# Patient Record
Sex: Female | Born: 1985 | Hispanic: Yes | Marital: Married | Smoking: Never smoker
Health system: Southern US, Community
[De-identification: ages and names within clinical notes are randomized; demographics above are authoritative.]

## PROBLEM LIST (undated history)

## (undated) DIAGNOSIS — E079 Disorder of thyroid, unspecified: Secondary | ICD-10-CM

## (undated) HISTORY — DX: Disorder of thyroid, unspecified: E07.9

---

## 2020-11-04 ENCOUNTER — Emergency Department (HOSPITAL_COMMUNITY): Payer: No Typology Code available for payment source

## 2020-11-04 ENCOUNTER — Emergency Department (HOSPITAL_COMMUNITY)
Admission: EM | Admit: 2020-11-04 | Discharge: 2020-11-04 | Disposition: A | Discharge status: Home / Self Care | Payer: No Typology Code available for payment source | Attending: Emergency Medicine | Admitting: Emergency Medicine

## 2020-11-04 DIAGNOSIS — R109 Unspecified abdominal pain: Secondary | ICD-10-CM | POA: Diagnosis not present

## 2020-11-04 DIAGNOSIS — R079 Chest pain, unspecified: Secondary | ICD-10-CM | POA: Diagnosis not present

## 2020-11-04 DIAGNOSIS — M542 Cervicalgia: Secondary | ICD-10-CM

## 2020-11-04 DIAGNOSIS — Y92410 Unspecified street and highway as the place of occurrence of the external cause: Secondary | ICD-10-CM | POA: Diagnosis not present

## 2020-11-04 DIAGNOSIS — S199XXA Unspecified injury of neck, initial encounter: Secondary | ICD-10-CM | POA: Diagnosis present

## 2020-11-04 DIAGNOSIS — R519 Headache, unspecified: Secondary | ICD-10-CM | POA: Diagnosis not present

## 2020-11-04 DIAGNOSIS — S161XXA Strain of muscle, fascia and tendon at neck level, initial encounter: Secondary | ICD-10-CM | POA: Diagnosis not present

## 2020-11-04 DIAGNOSIS — T148XXA Other injury of unspecified body region, initial encounter: Secondary | ICD-10-CM

## 2020-11-04 LAB — POC URINE PREG, ED: Preg Test, Ur: NEGATIVE

## 2020-11-04 MED ORDER — OXYCODONE-ACETAMINOPHEN 5-325 MG PO TABS
1.0000 | ORAL_TABLET | Freq: Once | ORAL | Status: AC
  Administered 2020-11-04: 1 via ORAL
  Filled 2020-11-04: qty 1

## 2020-11-04 MED ORDER — METHOCARBAMOL 500 MG PO TABS: 500.0000 mg | ORAL_TABLET | Freq: Two times a day (BID) | ORAL | 0 refills | Status: AC

## 2020-11-04 MED ORDER — ONDANSETRON 4 MG PO TBDP
4.0000 mg | ORAL_TABLET | Freq: Once | ORAL | Status: AC
  Administered 2020-11-04: 4 mg via ORAL
  Filled 2020-11-04: qty 1

## 2020-11-04 NOTE — ED Notes (Signed)
All appropriate discharge materials reviewed at length with patient. Time for questions provided. Pt has no other questions at this time and verbalizes understanding of all provided materials.  
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
## 2020-11-04 NOTE — Discharge Instructions (Signed)
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
## 2022-02-25 IMAGING — CT CT CERVICAL SPINE W/O CM
3 of 4 series · 13 of 33 positions shown, 16 images · non-contrast
Comparison: None.

CLINICAL DATA: Neck pain and stiffness.

EXAM:
CT CERVICAL SPINE WITHOUT CONTRAST
TECHNIQUE: Multidetector CT imaging of the cervical spine was performed without
intravenous contrast. Multiplanar CT image reconstructions were also
generated.

[Series 4: c_spine 2.0 st · axial · 0.37mm/px · z∈[-244,-94]mm · 5 of 113 slices shown, 7 images]
[im 19/113  soft-tissue]
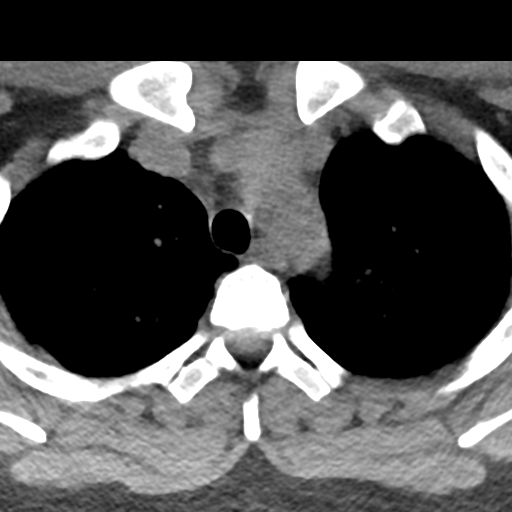
[im 19/113  bone]
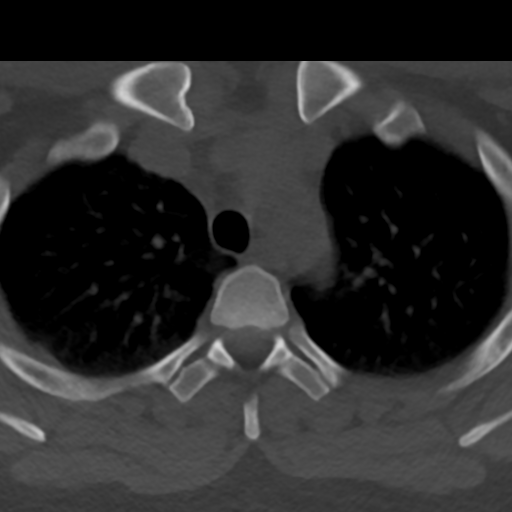
[im 38/113  bone]
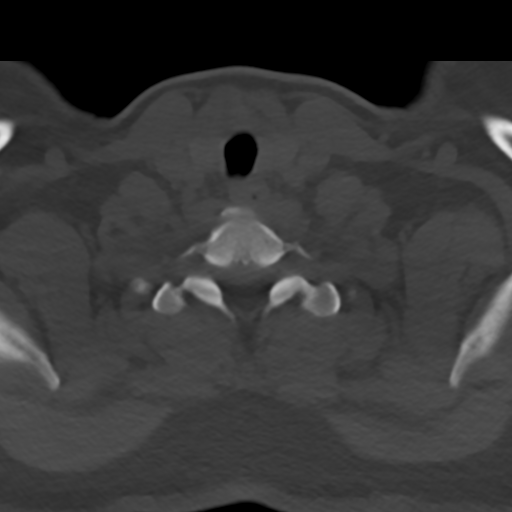
[im 57/113  bone]
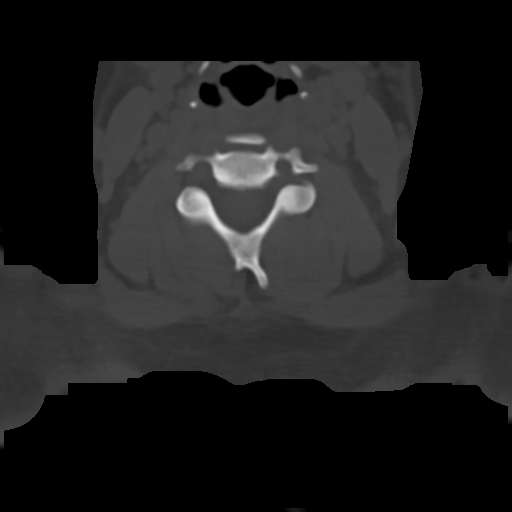
[im 75/113  bone]
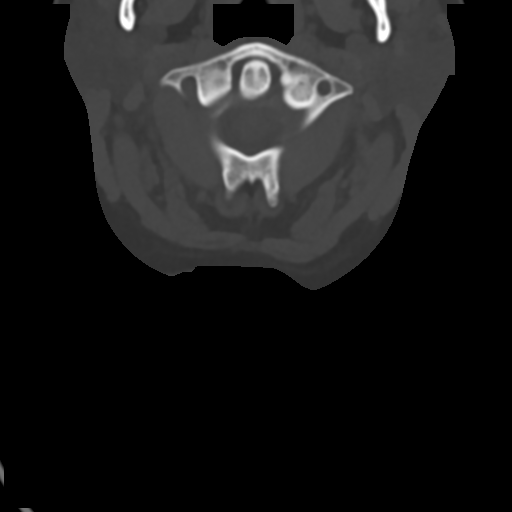
[im 94/113  soft-tissue]
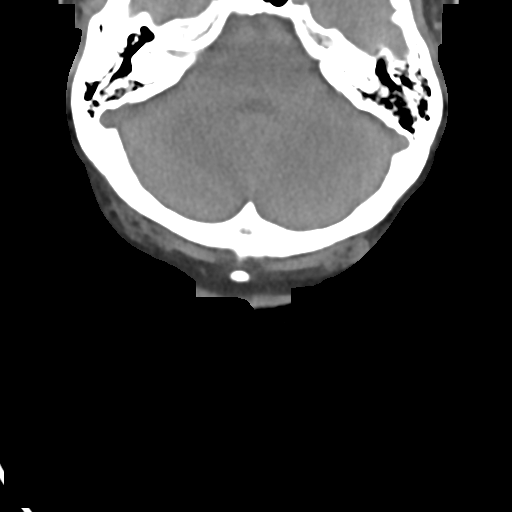
[im 94/113  bone]
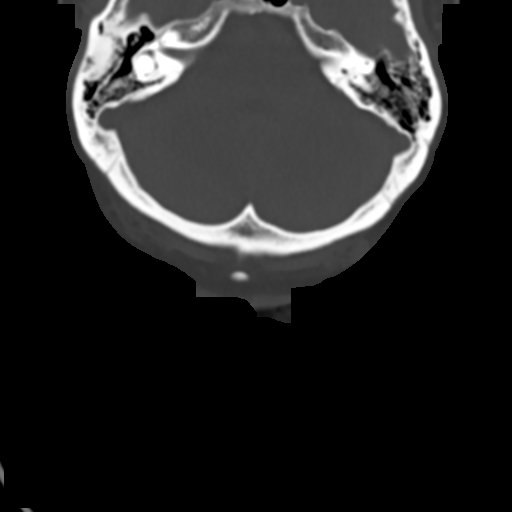

[Series 6: c_spine 2.0 sag bone · sagittal · 0.44mm/px · 5 of 61 slices shown, 6 images]
[im 21/61  bone]
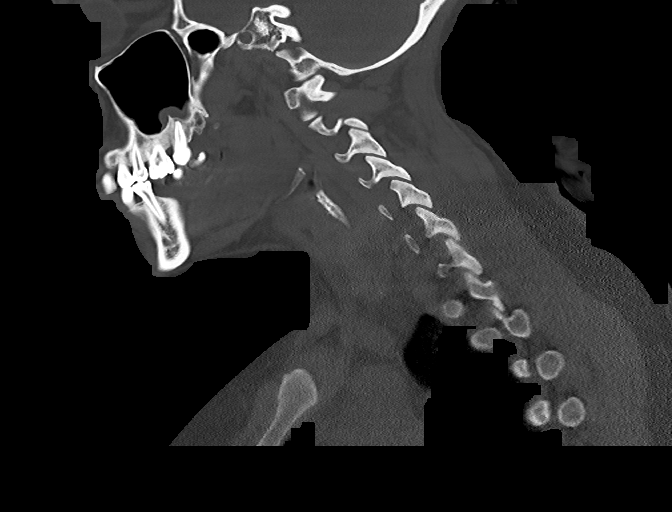
[im 26/61  bone]
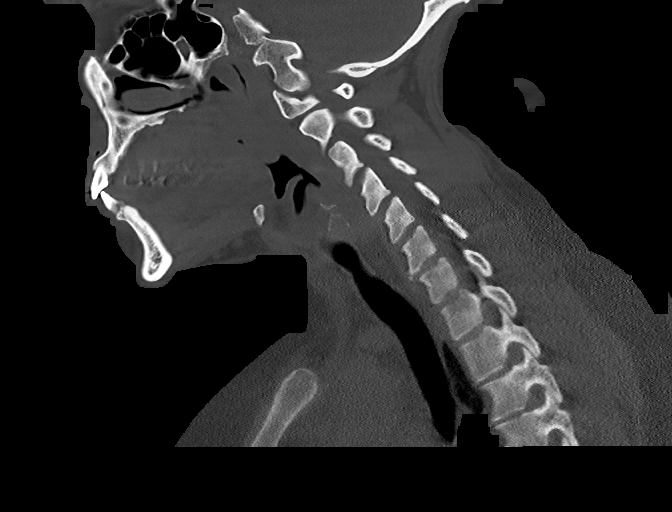
[im 31/61  soft-tissue]
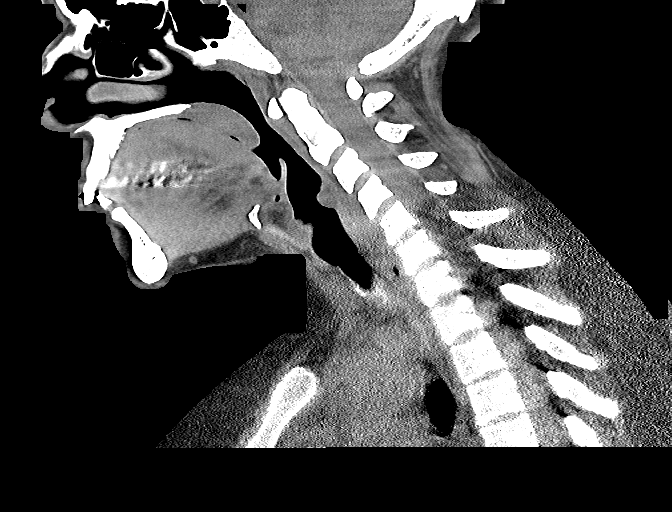
[im 31/61  bone]
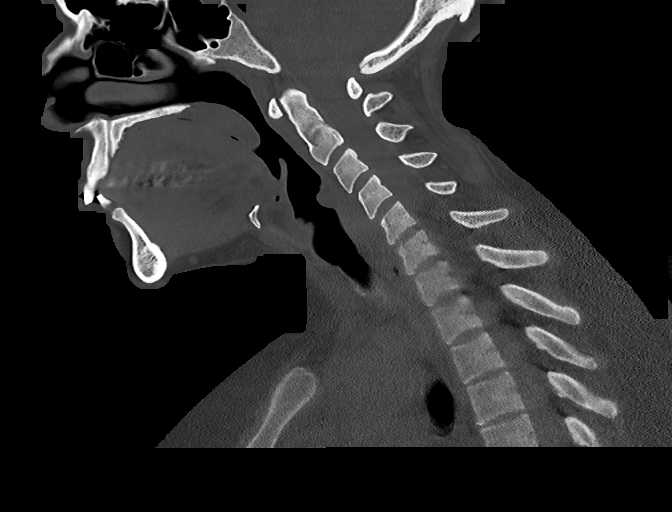
[im 36/61  bone]
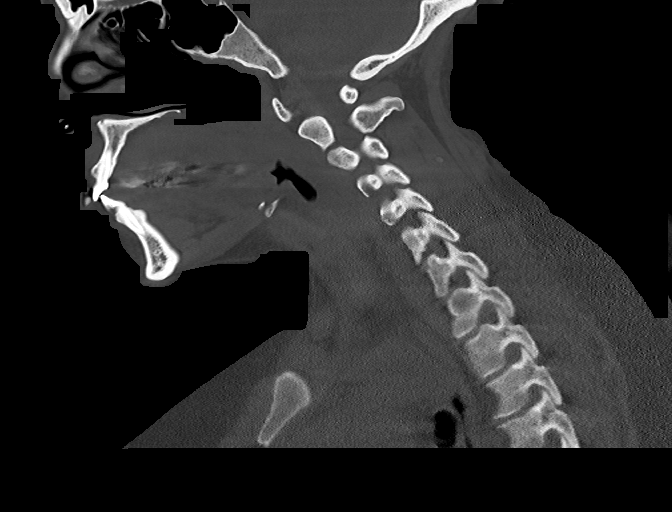
[im 41/61  bone]
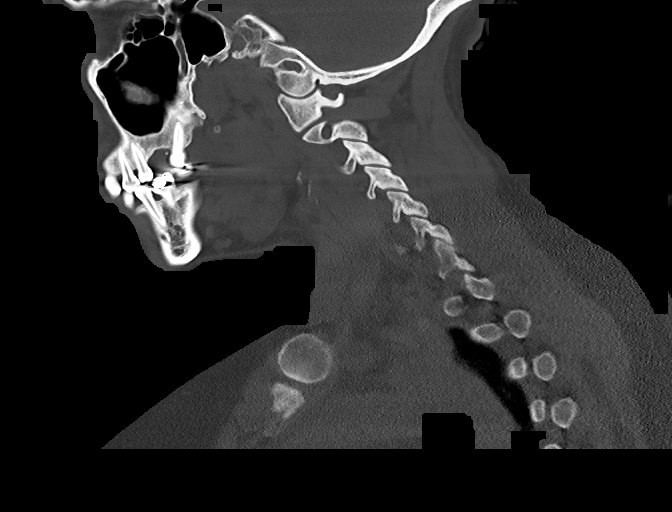

[Series 7: c_spine 2.0 cor bone · coronal · 0.33mm/px · 3 of 135 slices shown]
[im 27/135  bone]
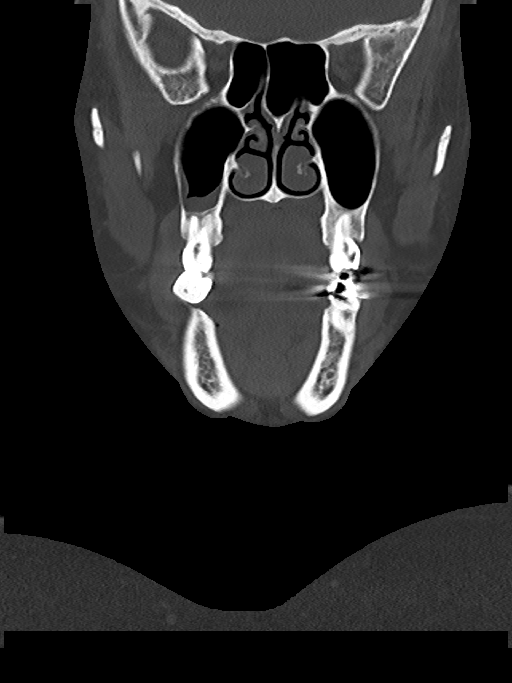
[im 54/135  bone]
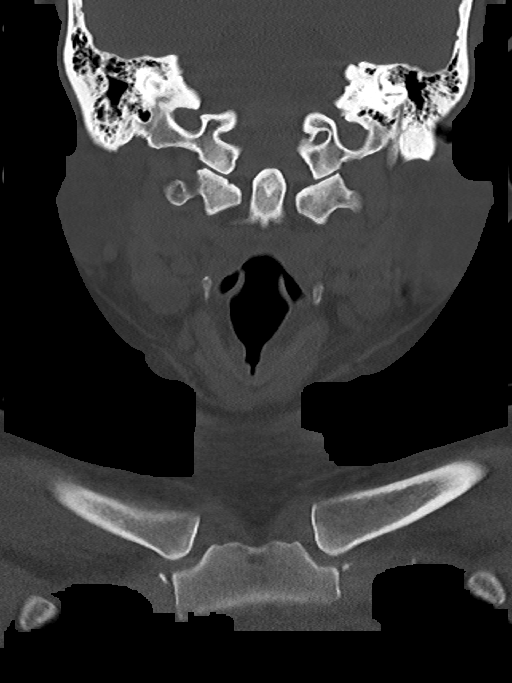
[im 81/135  bone]
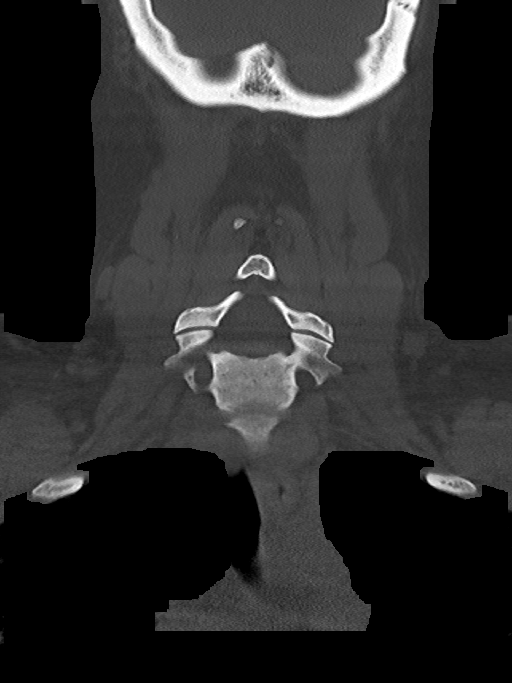

[13 of 33 positions shown; findings below may reference images not displayed]

FINDINGS: Alignment: Reversal of normal cervical lordosis is noted which may
be due to muscle spasm or patient positioning.

Skull base and vertebrae: No acute fracture or dislocation.

Soft tissues and spinal canal: No prevertebral fluid or swelling. No
visible canal hematoma.

Disc levels: Mild disc space narrowing and endplate spurring is
noted at C6-7.

Upper chest: Negative.

Other: None
IMPRESSION: 1. No acute abnormality.
2. Mild degenerative disc disease at C6-7

## 2024-08-04 ENCOUNTER — Other Ambulatory Visit: Payer: Self-pay

## 2024-08-04 ENCOUNTER — Encounter (HOSPITAL_COMMUNITY): Payer: Self-pay | Admitting: *Deleted

## 2024-08-04 ENCOUNTER — Emergency Department (HOSPITAL_COMMUNITY): Admission: EM | Admit: 2024-08-04 | Discharge: 2024-08-04 | Disposition: A | Discharge status: Home / Self Care

## 2024-08-04 DIAGNOSIS — J029 Acute pharyngitis, unspecified: Secondary | ICD-10-CM | POA: Diagnosis present

## 2024-08-04 DIAGNOSIS — H9209 Otalgia, unspecified ear: Secondary | ICD-10-CM | POA: Diagnosis not present

## 2024-08-04 MED ORDER — IBUPROFEN 400 MG PO TABS
600.0000 mg | ORAL_TABLET | Freq: Once | ORAL | Status: AC
  Administered 2024-08-04: 600 mg via ORAL
  Filled 2024-08-04: qty 1

## 2024-08-04 MED ORDER — AMOXICILLIN 500 MG PO CAPS
500.0000 mg | ORAL_CAPSULE | Freq: Once | ORAL | Status: AC
  Administered 2024-08-04: 500 mg via ORAL
  Filled 2024-08-04: qty 1

## 2024-08-04 MED ORDER — AMOXICILLIN 500 MG PO CAPS: 500.0000 mg | ORAL_CAPSULE | Freq: Two times a day (BID) | ORAL | 0 refills | Status: AC

## 2024-08-04 NOTE — ED Triage Notes (Signed)
 Bilateral ear pain for 2 days, denies any fever or chills.

## 2024-08-04 NOTE — Discharge Instructions (Signed)
 Evaluation was overall reassuring but inspection of the throat was concerning for strep pharyngitis.  Will go ahead and treat you with amoxicillin.  Please take it for the next 10 days and follow-up with your primary care doctor.  You can take Tylenol  and ibuprofen as needed for pain and fever control.  If you have any trouble breathing or trouble swallowing start to drool or persistently high fever please return to the ED for further evaluation.

## 2024-08-04 NOTE — ED Provider Notes (Signed)
 " [place] EMERGENCY DEPARTMENT AT [facility] Provider Note   CSN: [id number] Arrival date & time: 08/04/24  2204     Patient presents with: Otalgia  HPI Tracy Kaufman is a 39 y.o. female presenting for bilateral ear pain and sore throat.  Has been going on for about a week.  Reports that she had the flu over Christmas with her son.  She states she is mostly feeling better but has had a persistently sore throat her son's had the same symptom.  Had a fever last week but no longer.  Still able to eat and drink but is painful at times.  Denies trouble breathing or chest pain.  Denies tinnitus or hearing loss.  Denies any pain behind the ears.    Otalgia      Prior to Admission medications  Medication Sig Start Date End Date Taking? Authorizing Provider  amoxicillin (AMOXIL) 500 MG capsule Take 1 capsule (500 mg total) by mouth 2 (two) times daily for 10 days. 08/04/24 08/14/24 Yes Lang Norleen POUR, PA-C  methocarbamol  (ROBAXIN ) 500 MG tablet Take 1 tablet (500 mg total) by mouth 2 (two) times daily. 11/04/20   Layden, Lindsey A, PA-C    Allergies: Patient has no known allergies.    Review of Systems  HENT:  Positive for ear pain.     Updated Vital Signs BP (!) 143/78 (BP Location: Right Arm)   Pulse (!) 106   Temp 98.1 F (36.7 C)   Resp 18   Ht 5' 4 (1.626 m)   Wt 90.7 kg   LMP 07/06/2024   SpO2 94%   BMI 34.33 kg/m   Physical Exam Vitals and nursing note reviewed.  HENT:     Head: Normocephalic and atraumatic.     Right Ear: Hearing, tympanic membrane, ear canal and external ear normal.     Left Ear: Hearing, tympanic membrane, ear canal and external ear normal.     Mouth/Throat:     Mouth: Mucous membranes are moist.     Comments: Erythema and edema noted in the posterior oropharynx.  Uvula is also swollen but midline.  No abscess noted.  Soft palate is nonedematous.  Tonsillar exudate noted on the left side.  Eyes:     General:        Right eye: No  discharge.        Left eye: No discharge.     Conjunctiva/sclera: Conjunctivae normal.  Cardiovascular:     Rate and Rhythm: Normal rate and regular rhythm.     Pulses: Normal pulses.     Heart sounds: Normal heart sounds.  Pulmonary:     Effort: Pulmonary effort is normal.     Breath sounds: Normal breath sounds.  Abdominal:     General: Abdomen is flat.     Palpations: Abdomen is soft.  Skin:    General: Skin is warm and dry.  Neurological:     General: No focal deficit present.  Psychiatric:        Mood and Affect: Mood normal.     (all labs ordered are listed, but only abnormal results are displayed) Labs Reviewed - No data to display  EKG: None  Radiology: No results found.   Procedures   Medications Ordered in the ED  amoxicillin (AMOXIL) capsule 500 mg (has no administration in time range)  ibuprofen (ADVIL) tablet 600 mg (has no administration in time range)  Medical Decision Making  39 year old well-appearing female presenting for persistent sore throat and otalgia.  Exam of her ears relatively normal.  No pain tenderness or swelling behind the ear.  Posterior throat findings are concerning for strep throat could still be residual symptoms from flu but will go ahead and proceed with treatment of strep with amoxicillin.  Advised her to follow with her PCP.  No suggestions of peritonsillar abscess.  She looks well and nonseptic.  Feel she safe for discharge. Discussed return precautions. Discharged good condition.     Final diagnoses:  Otalgia, unspecified laterality  Sore throat    ED Discharge Orders          Ordered    amoxicillin (AMOXIL) 500 MG capsule  2 times daily        08/04/24 2239               Michala Deblanc K, PA-C 08/04/24 2240    Ula Prentice SAUNDERS, MD 08/04/24 2356  "

## 2024-08-16 ENCOUNTER — Ambulatory Visit: Admitting: Podiatry

## 2024-08-16 DIAGNOSIS — M216X1 Other acquired deformities of right foot: Secondary | ICD-10-CM | POA: Diagnosis not present

## 2024-08-16 DIAGNOSIS — M216X2 Other acquired deformities of left foot: Secondary | ICD-10-CM | POA: Diagnosis not present

## 2024-08-16 DIAGNOSIS — B351 Tinea unguium: Secondary | ICD-10-CM | POA: Diagnosis not present

## 2024-08-16 DIAGNOSIS — Z79899 Other long term (current) drug therapy: Secondary | ICD-10-CM | POA: Diagnosis not present

## 2024-08-16 NOTE — Progress Notes (Signed)
 "  Subjective:  Patient ID: Tracy Kaufman, female    DOB: 03/08/1986,  MRN: [id number]  Chief Complaint  Patient presents with   Nail Problem    Bilateral hallux nail discoloration     39 y.o. female presents with the above complaint.  Patient presents with bilateral hallux thickened elongated dystrophic mycotic toenails x 10 mild pain on palpation hurts with ambulation or shoe pressure she would like to discuss treatment options for over-the-counter medication has not helped.  She has secondary complaint of flatfoot deformity she is having some occasional arch and heel pain would like to discuss orthotics option as well.  Denies any other acute complaints.   Review of Systems: Negative except as noted in the HPI. Denies N/V/F/Ch.  Past Medical History:  Diagnosis Date   Thyroid disease    Current Medications[1]  Tobacco Use History[2]  Allergies[3] Objective:  There were no vitals filed for this visit. There is no height or weight on file to calculate BMI. Constitutional Well developed. Well nourished.  Vascular Dorsalis pedis pulses palpable bilaterally. Posterior tibial pulses palpable bilaterally. Capillary refill normal to all digits.  No cyanosis or clubbing noted. Pedal hair growth normal.  Neurologic Normal speech. Oriented to person, place, and time. Epicritic sensation to light touch grossly present bilaterally.  Dermatologic Nails thickened elongated dystrophic mycotic toenails x 10 mild pain on palpation Skin within normal limits  Orthopedic: Gait examination shows pes planovalgus foot structure with calcaneovalgus to many toe signs partially but recurred the arch with dorsiflexion of the hallux unable to perform single and double heel raise.   Radiographs: None Assessment:   1. Long-term use of high-risk medication   2. Other acquired deformities of right foot   3. Other acquired deformities of left foot   4. Onychomycosis due to dermatophyte    Plan:   Patient was evaluated and treated and all questions answered.  Bilateral hallux onychomycosis -Educated the patient on the etiology of onychomycosis and various treatment options associated with improving the fungal load.  I explained to the patient that there is 3 treatment options available to treat the onychomycosis including topical, p.o., laser treatment.  Patient elected to undergo p.o. options with Lamisil /terbinafine  therapy.  In order for me to start the medication therapy, I explained to the patient the importance of evaluating the liver and obtaining the liver function test.  Once the liver function test comes back normal I will start him on 31-month course of Lamisil  therapy.  Patient understood all risk and would like to proceed with Lamisil  therapy.  I have asked the patient to immediately stop the Lamisil  therapy if she has any reactions to it and call the office or go to the emergency room right away.  Patient states understanding   Pes planovalgus/foot deformity -I explained to patient the etiology of pes planovalgus and relationship with heel pain/arch pain and various treatment options were discussed.  Given patient foot structure in the setting of heel pain/arch pain I believe patient will benefit from custom-made orthotics to help control the hindfoot motion support the arch of the foot and take the stress away from arches.  Patient agrees with the plan like to proceed with orthotics -Patient was casted for orthotics   No follow-ups on file.    [1]  Current Outpatient Medications:    methocarbamol  (ROBAXIN ) 500 MG tablet, Take 1 tablet (500 mg total) by mouth 2 (two) times daily., Disp: 20 tablet, Rfl: 0   terbinafine  (LAMISIL ) 250 MG  tablet, Take 1 tablet (250 mg total) by mouth daily., Disp: 90 tablet, Rfl: 0 [2]  Social History Tobacco Use  Smoking Status Never   Passive exposure: Never  Smokeless Tobacco Never  [3] No Known Allergies  "

## 2024-08-17 ENCOUNTER — Other Ambulatory Visit: Payer: Self-pay | Admitting: Podiatry

## 2024-08-17 LAB — HEPATIC FUNCTION PANEL
ALT: 21 IU/L (ref 0–32)
AST: 18 IU/L (ref 0–40)
Albumin: 4.2 g/dL (ref 3.9–4.9)
Alkaline Phosphatase: 129 IU/L — ABNORMAL HIGH (ref 41–116)
Bilirubin Total: 0.5 mg/dL (ref 0.0–1.2)
Bilirubin, Direct: 0.17 mg/dL (ref 0.00–0.40)
Total Protein: 6.8 g/dL (ref 6.0–8.5)

## 2024-08-17 MED ORDER — TERBINAFINE HCL 250 MG PO TABS: 250.0000 mg | ORAL_TABLET | Freq: Every day | ORAL | 0 refills | Status: AC

## 2024-09-05 ENCOUNTER — Telehealth: Payer: Self-pay
